# Patient Record
Sex: Female | Born: 1985 | Race: Black or African American | Hispanic: No | Marital: Single | Smoking: Current every day smoker
Health system: Southern US, Community
[De-identification: ages and names within clinical notes are randomized; demographics above are authoritative.]

## PROBLEM LIST (undated history)

## (undated) DIAGNOSIS — J45909 Unspecified asthma, uncomplicated: Secondary | ICD-10-CM

---

## 2013-03-19 ENCOUNTER — Emergency Department (HOSPITAL_COMMUNITY): Payer: Medicaid Other

## 2013-03-19 ENCOUNTER — Encounter (HOSPITAL_COMMUNITY): Payer: Self-pay | Admitting: Emergency Medicine

## 2013-03-19 ENCOUNTER — Emergency Department (HOSPITAL_COMMUNITY)
Admission: EM | Admit: 2013-03-19 | Discharge: 2013-03-20 | Disposition: A | Discharge status: Home / Self Care | Payer: Medicaid Other | Attending: Emergency Medicine | Admitting: Emergency Medicine

## 2013-03-19 DIAGNOSIS — R141 Gas pain: Secondary | ICD-10-CM | POA: Insufficient documentation

## 2013-03-19 DIAGNOSIS — R110 Nausea: Secondary | ICD-10-CM | POA: Insufficient documentation

## 2013-03-19 DIAGNOSIS — O99330 Smoking (tobacco) complicating pregnancy, unspecified trimester: Secondary | ICD-10-CM | POA: Insufficient documentation

## 2013-03-19 DIAGNOSIS — R142 Eructation: Secondary | ICD-10-CM | POA: Insufficient documentation

## 2013-03-19 DIAGNOSIS — O2390 Unspecified genitourinary tract infection in pregnancy, unspecified trimester: Secondary | ICD-10-CM | POA: Insufficient documentation

## 2013-03-19 DIAGNOSIS — Z3490 Encounter for supervision of normal pregnancy, unspecified, unspecified trimester: Secondary | ICD-10-CM

## 2013-03-19 DIAGNOSIS — N390 Urinary tract infection, site not specified: Secondary | ICD-10-CM | POA: Insufficient documentation

## 2013-03-19 LAB — COMPREHENSIVE METABOLIC PANEL
ALT: 10 U/L (ref 0–35)
AST: 22 U/L (ref 0–37)
Albumin: 2.9 g/dL — ABNORMAL LOW (ref 3.5–5.2)
Alkaline Phosphatase: 61 U/L (ref 39–117)
BUN: 7 mg/dL (ref 6–23)
CO2: 23 mEq/L (ref 19–32)
Calcium: 8.9 mg/dL (ref 8.4–10.5)
Chloride: 104 mEq/L (ref 96–112)
GFR calc non Af Amer: >90 mL/min (ref >90)
Potassium: 3.3 mEq/L — ABNORMAL LOW (ref 3.5–5.1)
Sodium: 137 mEq/L (ref 135–145)
Total Bilirubin: 0.4 mg/dL (ref 0.3–1.2)

## 2013-03-19 LAB — URINE MICROSCOPIC-ADD ON

## 2013-03-19 LAB — URINALYSIS, ROUTINE W REFLEX MICROSCOPIC
Bilirubin Urine: NEGATIVE
Hgb urine dipstick: NEGATIVE
Ketones, ur: 15 mg/dL — AB
Protein, ur: NEGATIVE mg/dL
Specific Gravity, Urine: 1.033 — ABNORMAL HIGH (ref 1.005–1.030)
Urobilinogen, UA: 1 mg/dL (ref 0.0–1.0)
pH: 6 (ref 5.0–8.0)

## 2013-03-19 LAB — POCT I-STAT, CHEM 8
BUN: 5 mg/dL — ABNORMAL LOW (ref 6–23)
Creatinine, Ser: 0.5 mg/dL (ref 0.50–1.10)
Glucose, Bld: 74 mg/dL (ref 70–99)
Hemoglobin: 11.2 g/dL — ABNORMAL LOW (ref 12.0–15.0)
Potassium: 3.4 mEq/L — ABNORMAL LOW (ref 3.5–5.1)
TCO2: 23 mmol/L (ref 0–100)

## 2013-03-19 LAB — LIPASE, BLOOD: Lipase: 35 U/L (ref 11–59)

## 2013-03-19 MED ORDER — ACETAMINOPHEN 500 MG PO TABS
1000.0000 mg | ORAL_TABLET | Freq: Once | ORAL | Status: AC
  Administered 2013-03-20: 1000 mg via ORAL
  Filled 2013-03-19: qty 2

## 2013-03-19 NOTE — ED Notes (Signed)
Pt c/o right sided lower quadrant abdominal pain. Pt is [redacted] weeks pregnant. This 7th pregnancy. She has 4 kids. 2 abortions, o miscarriages. Denies N/V.

## 2013-03-19 NOTE — ED Provider Notes (Signed)
CSN: [id number]     Arrival date & time 03/19/13  1904 History   First MD Initiated Contact with Patient 03/19/13 2154     Chief Complaint  Patient presents with  . Abdominal Pain   (Consider location/radiation/quality/duration/timing/severity/associated sxs/prior Treatment) HPI Comments: Patient is a 27 year old G50P4 female who presents today with right lower quadrant abdominal pain. She believes she is approximately [redacted] weeks pregnant, however has not taken a positive pregnancy test. She began receiving Depo shots in July. Initially they gave her a pregnancy test which was negative. Her last Depo shot was on September 23. She did not receive a pregnancy test prior to receiving a shot. Over the past 2 days she is having right lower quadrant abdominal pain. She reports that this pain feels like a bad menstrual cramps. It is intermittent and resolves spontaneously. She denies any abnormal vaginal discharge or vaginal bleeding. She denies any loss of appetite or anorexia. She had some nausea yesterday, but no vomiting. Her last normal bowel movement was yesterday. She has had no prenatal care and is not taking any prenatal vitamins. She just recently moved here from [place], [place] and is trying to get her Medicaid switched over. She gave birth to her last child on May 3. All of her children have been spontaneous vaginal deliveries. Her last child was 2 months premature. She had abortion surgeries in 2004 and 2012. She believes that she is never required RhoGAM. She does not remember her blood type.  The history is provided by the patient. No language interpreter was used.    History reviewed. No pertinent past medical history. History reviewed. No pertinent past surgical history. History reviewed. No pertinent family history. History  Substance Use Topics  . Smoking status: Current Every Day Smoker -- 0.50 packs/day    Types: Cigarettes  . Smokeless tobacco: Never Used  . Alcohol Use:  No   OB History   Grav Para Term Preterm Abortions TAB SAB Ect Mult Living                 Review of Systems  Constitutional: Negative for fever and chills.  Respiratory: Negative for shortness of breath.   Cardiovascular: Negative for chest pain.  Gastrointestinal: Positive for nausea, abdominal pain and abdominal distention. Negative for vomiting, diarrhea and constipation.  Genitourinary: Negative for dysuria, urgency, frequency, vaginal bleeding, vaginal discharge and vaginal pain.  All other systems reviewed and are negative.    Allergies  Sulfa antibiotics  Home Medications  No current outpatient prescriptions on file. BP 139/65  Pulse 83  Temp(Src) 98.8 F (37.1 C) (Oral)  Resp 16  Wt 142 lb 6.4 oz (64.592 kg)  SpO2 99%  LMP 09/03/2012 Physical Exam  Nursing note and vitals reviewed. Constitutional: She is oriented to person, place, and time. She appears well-developed and well-nourished. No distress.  HENT:  Head: Normocephalic and atraumatic.  Right Ear: External ear normal.  Left Ear: External ear normal.  Nose: Nose normal.  Mouth/Throat: Oropharynx is clear and moist.  Eyes: Conjunctivae are normal.  Neck: Normal range of motion.  Cardiovascular: Normal rate, regular rhythm and normal heart sounds.   Pulmonary/Chest: Effort normal and breath sounds normal. No stridor. No respiratory distress. She has no wheezes. She has no rales.  Abdominal: Soft. She exhibits distension. There is tenderness in the right lower quadrant. There is no rigidity, no rebound and no guarding.    Musculoskeletal: Normal range of motion.  Neurological: She is alert  and oriented to person, place, and time. She has normal strength.  Skin: Skin is warm and dry. She is not diaphoretic. No erythema.  Psychiatric: She has a normal mood and affect. Her behavior is normal.    ED Course  Procedures (including critical care time) Labs Review Labs Reviewed  COMPREHENSIVE METABOLIC  PANEL - Abnormal; Notable for the following:    Potassium 3.3 (*)    Glucose, Bld 69 (*)    Creatinine, Ser 0.44 (*)    Albumin 2.9 (*)    All other components within normal limits  URINALYSIS, ROUTINE W REFLEX MICROSCOPIC - Abnormal; Notable for the following:    Color, Urine AMBER (*)    Specific Gravity, Urine 1.033 (*)    Ketones, ur 15 (*)    Nitrite POSITIVE (*)    All other components within normal limits  CBC WITH DIFFERENTIAL - Abnormal; Notable for the following:    RBC 3.45 (*)    Hemoglobin 10.0 (*)    HCT 30.3 (*)    All other components within normal limits  URINE MICROSCOPIC-ADD ON - Abnormal; Notable for the following:    Squamous Epithelial / LPF FEW (*)    Bacteria, UA MANY (*)    All other components within normal limits  POCT I-STAT, CHEM 8 - Abnormal; Notable for the following:    Potassium 3.4 (*)    BUN 5 (*)    Calcium, Ion 1.25 (*)    Hemoglobin 11.2 (*)    HCT 33.0 (*)    All other components within normal limits  POCT PREGNANCY, URINE - Abnormal; Notable for the following:    Preg Test, Ur POSITIVE (*)    All other components within normal limits  LIPASE, BLOOD  ABO/RH   Imaging Review US Ob Limited  03/19/2013   CLINICAL DATA:  27 year old pregnant female with right pelvic pain. Assigned gestational age of [redacted] weeks 5 days by 1st ultrasound.  EXAM: LIMITED OBSTETRIC ULTRASOUND AND TRANSVAGINAL OBSTETRIC ULTRASOUND  FINDINGS: Number of Fetuses: 1  Heart Rate:  144 bpm  Movement: Yes  Presentation: Cephalic  Placental Location: Fundal posterior  Previa:  No  Amniotic Fluid (Subjective):  Within normal limits.  BPD:  71.5cm  28w  5d  MATERNAL FINDINGS:  Cervix:  Appears closed.  Length of 3.7 cm.  Uterus/Adnexae:  No abnormality visualized.  IMPRESSION: Single living intrauterine gestation with assigned gestational age of [redacted] weeks 5 days.  No significant abnormalities identified.  This exam is performed on an emergent basis and does not comprehensively  evaluate fetal size, dating, or anatomy; follow-up complete OB US should be considered if further fetal assessment is warranted.   Electronically Signed   By: Laveda Abbe M.D.   On: 03/19/2013 23:56   US Ob Transvaginal  03/19/2013   CLINICAL DATA:  27 year old pregnant female with right pelvic pain. Assigned gestational age of [redacted] weeks 5 days by 1st ultrasound.  EXAM: LIMITED OBSTETRIC ULTRASOUND AND TRANSVAGINAL OBSTETRIC ULTRASOUND  FINDINGS: Number of Fetuses: 1  Heart Rate:  144 bpm  Movement: Yes  Presentation: Cephalic  Placental Location: Fundal posterior  Previa:  No  Amniotic Fluid (Subjective):  Within normal limits.  BPD:  71.5cm  28w  5d  MATERNAL FINDINGS:  Cervix:  Appears closed.  Length of 3.7 cm.  Uterus/Adnexae:  No abnormality visualized.  IMPRESSION: Single living intrauterine gestation with assigned gestational age of [redacted] weeks 5 days.  No significant abnormalities identified.  This exam is performed  on an emergent basis and does not comprehensively evaluate fetal size, dating, or anatomy; follow-up complete OB US should be considered if further fetal assessment is warranted.   Electronically Signed   By: Laveda Abbe M.D.   On: 03/19/2013 23:56    EKG Interpretation   None       MDM   1. Pregnancy   2. UTI (lower urinary tract infection)    Patient presents with abdominal cramping. Diagnosed with pregnancy and third trimester. Fetus is approximately 28 weeks and 5 days. No significant abnormalities were noted on the ultrasound. Cramping is worse in the right lower quadrant. I have very little concern for appendicitis at this time. I did give the patient strict return instructions.  I have urged the patient to followup with an obstetrics physician. She was given a Facilities manager as well as information to [facility]. No abnormal vaginal bleeding or vaginal discharge. Urged patient to begin prenatal vitamins as soon as possible. Patient also diagnosed with urinary tract infection.  She was given Keflex. I discussed this case with Dr. Deretha Emory who agrees with plan. Vital signs stable for discharge. Patient / Family / Caregiver informed of clinical course, understand medical decision-making process, and agree with plan.    Mora Bellman, PA-C 03/20/13 0130

## 2013-03-20 LAB — CBC WITH DIFFERENTIAL/PLATELET
Basophils Absolute: 0 10*3/uL (ref 0.0–0.1)
Basophils Relative: 0 % (ref 0–1)
Eosinophils Absolute: 0.3 10*3/uL (ref 0.0–0.7)
Hemoglobin: 10 g/dL — ABNORMAL LOW (ref 12.0–15.0)
Lymphocytes Relative: 29 % (ref 12–46)
MCH: 29 pg (ref 26.0–34.0)
MCHC: 33 g/dL (ref 30.0–36.0)
Monocytes Absolute: 0.6 10*3/uL (ref 0.1–1.0)
Monocytes Relative: 9 % (ref 3–12)
Neutro Abs: 4 10*3/uL (ref 1.7–7.7)
Neutrophils Relative %: 57 % (ref 43–77)
RDW: 12.4 % (ref 11.5–15.5)
WBC: 7 10*3/uL (ref 4.0–10.5)

## 2013-03-20 LAB — ABO/RH: ABO/RH(D): A POS

## 2013-03-20 MED ORDER — CEPHALEXIN 500 MG PO CAPS: ORAL_CAPSULE | ORAL | Status: DC

## 2013-03-23 NOTE — ED Provider Notes (Signed)
Medical screening examination/treatment/procedure(s) were performed by non-physician practitioner and as supervising physician I was immediately available for consultation/collaboration.  EKG Interpretation   None         Yumna Ebers W. Zaiden Ludlum, MD 03/23/13 1527 

## 2013-03-31 NOTE — L&D Delivery Note (Signed)
Delivery Note NN team present prior to delivery. SROM> clear with pushing. At 1:18 PM a vigorous viable female was delivered via  (Presentation: OA>LOA;  ).  APGAR: , ; weight .   Placenta status: spontaneous, intact, 3VC. Cord:  with the following complications: none .  Cord pH: pending Anesthesia:  none Episiotomy: none Lacerations: none Suture Repair: n/a Est. Blood Loss (mL): 400 Placenta to pathology  Mom to postpartum.  Baby to NICU.  POE,DEIRDRE 01/27/2014, 1:34 PM

## 2013-03-31 NOTE — L&D Delivery Note (Signed)
Delivery Note At 8:32 AM a viable female was delivered via Vaginal, Spontaneous Delivery (Presentation: frank breech).  APGAR: 7,8 ; weight TBD.   Placenta status: Intact, Spontaneous.  Cord: 3 vessels with the following complications: .  Cord pH: 7.17  Anesthesia: None  Episiotomy: None Lacerations: n/a Suture Repair: na Est. Blood Loss (mL): 150cc  Mom to postpartum.  Baby to NICU.  Pt presented in MAU c/c/+2 and discovered to be frank breech presentation.  She pushed with good maternal effort and no epidural to deliver a liveborn preterm female from frank breech in the usual fashion.  Baby with spontaneous cry.  Cord cut and clamped and newborn transferred to the warmer under the care of the NICU team.   Placenta delivered spontaneously intact with 3 vessel cord.  Sent to pathology.  IM pitocin given. Mom doing well and baby to NICU due to prematurity.    [name] L 04/21/2013, 8:50 AM

## 2013-04-13 ENCOUNTER — Encounter: Payer: Medicaid Other | Admitting: Family

## 2013-04-13 ENCOUNTER — Encounter (HOSPITAL_COMMUNITY): Payer: Self-pay | Admitting: Emergency Medicine

## 2013-04-13 ENCOUNTER — Emergency Department (HOSPITAL_COMMUNITY)
Admission: EM | Admit: 2013-04-13 | Discharge: 2013-04-13 | Disposition: A | Discharge status: Home / Self Care | Payer: Medicaid Other | Attending: Emergency Medicine | Admitting: Emergency Medicine

## 2013-04-13 DIAGNOSIS — J45909 Unspecified asthma, uncomplicated: Secondary | ICD-10-CM | POA: Insufficient documentation

## 2013-04-13 DIAGNOSIS — O2390 Unspecified genitourinary tract infection in pregnancy, unspecified trimester: Secondary | ICD-10-CM | POA: Insufficient documentation

## 2013-04-13 DIAGNOSIS — N390 Urinary tract infection, site not specified: Secondary | ICD-10-CM | POA: Insufficient documentation

## 2013-04-13 DIAGNOSIS — O209 Hemorrhage in early pregnancy, unspecified: Secondary | ICD-10-CM | POA: Insufficient documentation

## 2013-04-13 DIAGNOSIS — O99330 Smoking (tobacco) complicating pregnancy, unspecified trimester: Secondary | ICD-10-CM | POA: Insufficient documentation

## 2013-04-13 DIAGNOSIS — Z79899 Other long term (current) drug therapy: Secondary | ICD-10-CM | POA: Insufficient documentation

## 2013-04-13 DIAGNOSIS — O2340 Unspecified infection of urinary tract in pregnancy, unspecified trimester: Secondary | ICD-10-CM

## 2013-04-13 HISTORY — DX: Unspecified asthma, uncomplicated: J45.909

## 2013-04-13 LAB — URINE MICROSCOPIC-ADD ON

## 2013-04-13 LAB — URINALYSIS, ROUTINE W REFLEX MICROSCOPIC
GLUCOSE, UA: NEGATIVE mg/dL
HGB URINE DIPSTICK: NEGATIVE
Ketones, ur: NEGATIVE mg/dL
Nitrite: POSITIVE — AB
PH: 6.5 (ref 5.0–8.0)
Protein, ur: NEGATIVE mg/dL
Specific Gravity, Urine: 1.029 (ref 1.005–1.030)
UROBILINOGEN UA: 1 mg/dL (ref 0.0–1.0)

## 2013-04-13 MED ORDER — NITROFURANTOIN MONOHYD MACRO 100 MG PO CAPS: 100.0000 mg | ORAL_CAPSULE | Freq: Two times a day (BID) | ORAL | Status: DC

## 2013-04-13 NOTE — Discharge Instructions (Signed)
1. Medications: Macrobid, usual home medications 2. Treatment: rest, drink plenty of fluids,  3. Follow Up: Please followup with the women's clinic, OB/GYN who has resected with your appointment for 04/28/2013 at 9:38 AM.  He may contact them at [phone]  Preterm Labor Information Preterm labor is when labor starts at less than 37 weeks of pregnancy. The normal length of a pregnancy is 39 to 41 weeks. CAUSES Often, there is no identifiable underlying cause as to why a woman goes into preterm labor. One of the most common known causes of preterm labor is infection. Infections of the uterus, cervix, vagina, amniotic sac, bladder, kidney, or even the lungs (pneumonia) can cause labor to start. Other suspected causes of preterm labor include:   Urogenital infections, such as yeast infections and bacterial vaginosis.   Uterine abnormalities (uterine shape, uterine septum, fibroids, or bleeding from the placenta).   A cervix that has been operated on (it may fail to stay closed).   Malformations in the fetus.   Multiple gestations (twins, triplets, and so on).   Breakage of the amniotic sac.  RISK FACTORS  Having a previous history of preterm labor.   Having premature rupture of membranes (PROM).   Having a placenta that covers the opening of the cervix (placenta previa).   Having a placenta that separates from the uterus (placental abruption).   Having a cervix that is too weak to hold the fetus in the uterus (incompetent cervix).   Having too much fluid in the amniotic sac (polyhydramnios).   Taking illegal drugs or smoking while pregnant.   Not gaining enough weight while pregnant.   Being younger than 79 and older than 29 years old.   Having a low socioeconomic status.   Being African American. SYMPTOMS Signs and symptoms of preterm labor include:   Menstrual-like cramps, abdominal pain, or back pain.  Uterine contractions that are regular, as  frequent as six in an hour, regardless of their intensity (may be mild or painful).  Contractions that start on the top of the uterus and spread down to the lower abdomen and back.   A sense of increased pelvic pressure.   A watery or bloody mucus discharge that comes from the vagina.  TREATMENT Depending on the length of the pregnancy and other circumstances, your health care provider may suggest bed rest. If necessary, there are medicines that can be given to stop contractions and to mature the fetal lungs. If labor happens before 34 weeks of pregnancy, a prolonged hospital stay may be recommended. Treatment depends on the condition of both you and the fetus.  WHAT SHOULD YOU DO IF YOU THINK YOU ARE IN PRETERM LABOR? Call your health care provider right away. You will need to go to the hospital to get checked immediately. HOW CAN YOU PREVENT PRETERM LABOR IN FUTURE PREGNANCIES? You should:   Stop smoking if you smoke.  Maintain healthy weight gain and avoid chemicals and drugs that are not necessary.  Be watchful for any type of infection.  Inform your health care provider if you have a known history of preterm labor. Document Released: 06/07/2003 Document Revised: 11/17/2012 Document Reviewed: 04/19/2012 [facility] Patient Information 2014 [place], [place].  Urinary Tract Infection Urinary tract infections (UTIs) can develop anywhere along your urinary tract. Your urinary tract is your body's drainage system for removing wastes and extra water. Your urinary tract includes two kidneys, two ureters, a bladder, and a urethra. Your kidneys are a pair of bean-shaped organs. Each  kidney is about the size of your fist. They are located below your ribs, one on each side of your spine. CAUSES Infections are caused by microbes, which are microscopic organisms, including fungi, viruses, and bacteria. These organisms are so small that they can only be seen through a microscope. Bacteria are the  microbes that most commonly cause UTIs. SYMPTOMS  Symptoms of UTIs may vary by age and gender of the patient and by the location of the infection. Symptoms in young women typically include a frequent and intense urge to urinate and a painful, burning feeling in the bladder or urethra during urination. Older women and men are more likely to be tired, shaky, and weak and have muscle aches and abdominal pain. A fever may mean the infection is in your kidneys. Other symptoms of a kidney infection include pain in your back or sides below the ribs, nausea, and vomiting. DIAGNOSIS To diagnose a UTI, your caregiver will ask you about your symptoms. Your caregiver also will ask to provide a urine sample. The urine sample will be tested for bacteria and white blood cells. White blood cells are made by your body to help fight infection. TREATMENT  Typically, UTIs can be treated with medication. Because most UTIs are caused by a bacterial infection, they usually can be treated with the use of antibiotics. The choice of antibiotic and length of treatment depend on your symptoms and the type of bacteria causing your infection. HOME CARE INSTRUCTIONS  If you were prescribed antibiotics, take them exactly as your caregiver instructs you. Finish the medication even if you feel better after you have only taken some of the medication.  Drink enough water and fluids to keep your urine clear or pale yellow.  Avoid caffeine, tea, and carbonated beverages. They tend to irritate your bladder.  Empty your bladder often. Avoid holding urine for long periods of time.  Empty your bladder before and after sexual intercourse.  After a bowel movement, women should cleanse from front to back. Use each tissue only once. SEEK MEDICAL CARE IF:   You have back pain.  You develop a fever.  Your symptoms do not begin to resolve within 3 days. SEEK IMMEDIATE MEDICAL CARE IF:   You have severe back pain or lower abdominal  pain.  You develop chills.  You have nausea or vomiting.  You have continued burning or discomfort with urination. MAKE SURE YOU:   Understand these instructions.  Will watch your condition.  Will get help right away if you are not doing well or get worse. Document Released: 12/25/2004 Document Revised: 09/16/2011 Document Reviewed: 04/25/2011 [facility] Patient Information 2014 [place], [place].

## 2013-04-13 NOTE — Progress Notes (Signed)
Appoint[facility] made for Pt January 29th, 2015 [facility] 9[facility]30am [facility]

## 2013-04-13 NOTE — Progress Notes (Addendum)
History reviewed with pt. Pt states she is a G7P4 with all previous vaginal deliveries with one at 32 weeks May 2014. Pt reports having pink tinged discharged noted on tissue after voiding a few times this a.m. [facility] but did not make that appointment and came to ED for evaluation of pink d/c. Pt was previously seen in ED 03/19/13 and found to be 28 weeks and 5 days pregnant. Pt denies any pain, contractions, leakage of fluid or any other discharge. Pt noticed urine was cloudy and yellow. Pt denies pain, pressure or burning with urinating. Pt reports positive fetal movement. U/S reviewed on 03/19/13 to confirm G.A

## 2013-04-13 NOTE — ED Notes (Signed)
Rapid response ob nurse [place]

## 2013-04-13 NOTE — ED Provider Notes (Signed)
CSN: [id number]     Arrival date & time 04/13/13  1357 History  This chart was scribed for non-physician practitioner Dierdre ForthHannah Aldridge Krzyzanowski, PA-C working with Flint MelterElliott L Wentz, MD by Danella Maiersaroline Early, ED Scribe. This patient was seen in room TR10C/TR10C and the patient's care was started at 3:14 PM.    Chief Complaint  Patient presents with  . Hematuria   The history is provided by the patient. No language interpreter was used.   HPI Comments: [name] is a 28 y.o. female who is [redacted] weeks pregnant who presents to the Emergency Department complaining of hematuria. Pt states she urinated and 7 am this morning and when she wiped there was pink on the toilet paper. She wiped again and there was more pink. She states the same thing happened after urinating at 10:30am but not since. She denies abdominal pain, nausea, vomiting, dysuria, any other symptoms. She states she plans [facility] an OB [facility] but does not have one yet because she just found out she was pregnant [redacted] weeks ago when she came here for abdominal pain. She has had 7 pregnancies, 4 living children (all were term except last child 32 weeks), and 2 elective abortions.    Past Medical History  Diagnosis Date  . Asthma    History reviewed. No pertinent past surgical history. No family history on file. History  Substance Use Topics  . Smoking status: Current Every Day Smoker -- 0.50 packs/day    Types: Cigarettes  . Smokeless tobacco: Never Used  . Alcohol Use: No   OB History   Grav Para Term Preterm Abortions TAB SAB Ect Mult Living   7 4 3 1 2 2  0 0 0 4     Review of Systems  Constitutional: Negative for fever, diaphoresis, appetite change, fatigue and unexpected weight change.  HENT: Negative for mouth sores.   Eyes: Negative for visual disturbance.  Respiratory: Negative for cough, chest tightness, shortness of breath and wheezing.   Cardiovascular: Negative for chest pain.  Gastrointestinal: Negative for  nausea, vomiting, abdominal pain, diarrhea and constipation.  Endocrine: Negative for polydipsia, polyphagia and polyuria.  Genitourinary: Positive for vaginal bleeding. Negative for dysuria, urgency, frequency and hematuria.  Musculoskeletal: Negative for back pain and neck stiffness.  Skin: Negative for rash.  Allergic/Immunologic: Negative for immunocompromised state.  Neurological: Negative for syncope, light-headedness and headaches.  Hematological: Does not bruise/bleed easily.  Psychiatric/Behavioral: Negative for sleep disturbance. The patient is not nervous/anxious.     Allergies  Sulfa antibiotics  Home Medications   Current Outpatient Rx  Name  Route  Sig  Dispense  Refill  . Prenatal Vit-Fe Sulfate-FA (PRENATAL VITAMIN PO)   Oral   Take 1 tablet by mouth daily.         . nitrofurantoin, macrocrystal-monohydrate, (MACROBID) 100 MG capsule   Oral   Take 1 capsule (100 mg total) by mouth 2 (two) times daily. X 7 days   14 capsule   0    BP 112/75  Pulse 93  Temp(Src) 98.7 F (37.1 C)  Resp 16  Ht 5\' 5"  (1.651 m)  Wt 148 lb (67.132 kg)  BMI 24.63 kg/m2  SpO2 98%  LMP 09/03/2012 Physical Exam  Nursing note and vitals reviewed. Constitutional: She is oriented to person, place, and time. She appears well-developed and well-nourished. No distress.  Awake, alert, nontoxic appearance  HENT:  Head: Normocephalic and atraumatic.  Mouth/Throat: Oropharynx is clear and moist. No oropharyngeal exudate.  Eyes:  Conjunctivae are normal. No scleral icterus.  Neck: Normal range of motion. Neck supple.  Cardiovascular: Normal rate, regular rhythm, normal heart sounds and intact distal pulses.   No murmur heard. Pulmonary/Chest: Effort normal and breath sounds normal. No respiratory distress. She has no wheezes.  Clear and equal breath sounds  Abdominal: Soft. Bowel sounds are normal. She exhibits no distension and no mass. There is no hepatosplenomegaly. There is no  tenderness. There is no rebound, no guarding and no CVA tenderness. Hernia confirmed negative in the right inguinal area and confirmed negative in the left inguinal area.  Gravid uterus Palpable fetal movement No palpable uterine contractions  Genitourinary: Uterus normal. Pelvic exam was performed with patient supine. There is no rash, tenderness or lesion on the right labia. There is no rash, tenderness or lesion on the left labia. No erythema, tenderness or bleeding around the vagina. No foreign body around the vagina. No signs of injury around the vagina. Vaginal discharge (scant, pink) found.  Sterile speculum exam without gross blood, scant pink discharge, cervix appears closed.  Musculoskeletal: Normal range of motion. She exhibits no edema.  Lymphadenopathy:    She has no cervical adenopathy.       Right: No inguinal adenopathy present.       Left: No inguinal adenopathy present.  Neurological: She is alert and oriented to person, place, and time. She exhibits normal muscle tone. Coordination normal.  Speech is clear and goal oriented Moves extremities without ataxia  Skin: Skin is warm and dry. No rash noted. She is not diaphoretic. No erythema.  Psychiatric: She has a normal mood and affect. Her behavior is normal.    ED Course  Procedures (including critical care time) Medications - No data to display  DIAGNOSTIC STUDIES: Oxygen Saturation is 98% on RA, normal by my interpretation.    COORDINATION OF CARE: 3:29 PM- Discussed treatment plan with pt. Pt agrees to plan.    Labs Review Labs Reviewed  URINALYSIS, ROUTINE W REFLEX MICROSCOPIC - Abnormal; Notable for the following:    APPearance CLOUDY (*)    Bilirubin Urine SMALL (*)    Nitrite POSITIVE (*)    Leukocytes, UA MODERATE (*)    All other components within normal limits  URINE MICROSCOPIC-ADD ON - Abnormal; Notable for the following:    Squamous Epithelial / LPF FEW (*)    Bacteria, UA MANY (*)    Casts  HYALINE CASTS (*)    All other components within normal limits  URINE CULTURE   Imaging Review No results found.  EKG Interpretation   None       MDM   1. Urinary tract infection   2. Urinary tract infection during pregnancy      Ronnald Ramp presents with "vaginal bleeding" and no prenatal care.  No persistent vaginal bleeding. Patient reports last sexual intercourse was last night. G 7 [id number].  No blood in the vaginal vault on sterile speculum exam. UA with evidence of urinary tract infection. Patient evaluated by Joni Reining, rapid OB.  Baby appears healthy and she's not having contractions.   Pt has been diagnosed with a UTI. Pt is afebrile, no CVA tenderness, normotensive, and denies N/V. Pt to be dc home with antibiotics and instructions to follow up with OB/GYN.  Patient has followup appointment at [facility] clinic on 04/29/2011 at 9:30 AM.  It has been determined that no acute conditions requiring further emergency intervention are present at this time. The patient/guardian have been advised of the  diagnosis and plan. We have discussed signs and symptoms that warrant return to the ED, such as changes or worsening in symptoms.   Vital signs are stable at discharge.   BP 112/75  Pulse 93  Temp(Src) 98.7 F (37.1 C)  Resp 16  Ht 5\' 5"  (1.651 m)  Wt 148 lb (67.132 kg)  BMI 24.63 kg/m2  SpO2 98%  LMP 09/03/2012  Patient/guardian has voiced understanding and agreed to follow-up with the PCP or specialist.        Dierdre Forth, PA-C 04/13/13 1623

## 2013-04-13 NOTE — ED Notes (Signed)
States she went to use br wiped and there was blood unsure if vag or in urine pt is 32 week no prenatal care G 7 P4  A 2  L 4

## 2013-04-13 NOTE — Progress Notes (Signed)
Dr. Jolayne Pantheronstant called regarding pt visit to ED with c/o of pink discharge after wiping. Notified of category I FHR with no contraction. Notified of pink discharge that occurred this morning with no pain. MD stated do perform U/A and speculum to check for bleeding in the vaginal vault.

## 2013-04-13 NOTE — Progress Notes (Signed)
OB at ED. Pt waiting in triage, waiting on available room.

## 2013-04-13 NOTE — Progress Notes (Signed)
[facility] called regarding pt at 32 weeks with c/o vaginal bleeding. OB RR RN

## 2013-04-13 NOTE — Progress Notes (Signed)
SSE done small amount of pink mucus noted on end of speculum. No active bleeding noted and PA stated that the cervix was visually closed.

## 2013-04-14 NOTE — ED Provider Notes (Signed)
Medical screening examination/treatment/procedure(s) were performed by non-physician practitioner and as supervising physician I was immediately available for consultation/collaboration.  EKG Interpretation   None        Jenese Mischke L Sharley Keeler, MD 04/14/13 0012 

## 2013-04-15 LAB — URINE CULTURE: Colony Count: >=100000

## 2013-04-16 ENCOUNTER — Telehealth (HOSPITAL_COMMUNITY): Payer: Self-pay | Admitting: Emergency Medicine

## 2013-04-16 NOTE — ED Notes (Signed)
Post ED Visit - Positive Culture Follow-up  Culture report reviewed by antimicrobial stewardship pharmacist: []  Wes Dulaney, Pharm.D., BCPS []  Celedonio MiyamotoJeremy Frens, [address]Pharm[address]D., BCPS []  Georgina PillionElizabeth Martin, Pharm.D., BCPS [x]  DearingMinh Pham, [place]arm[place]D., BCPS, AAHIVP []  Estella HuskMichelle Turner, Pharm.D., BCPS, AAHIVP  Positive urine culture Treated with Macrobid, organism sensitive to the same and no further patient follow-up is required at this time.  Zeb ComfortHolland, Akaya Proffit 04/16/2013, 2:31 PM

## 2013-04-21 ENCOUNTER — Encounter (HOSPITAL_COMMUNITY): Payer: Self-pay | Admitting: *Deleted

## 2013-04-21 ENCOUNTER — Inpatient Hospital Stay (HOSPITAL_COMMUNITY)
Admission: AD | Admit: 2013-04-21 | Discharge: 2013-04-23 | DRG: 775 | Disposition: A | Discharge status: Home / Self Care | Payer: Medicaid Other | Source: Ambulatory Visit | Attending: Obstetrics & Gynecology | Admitting: Obstetrics & Gynecology

## 2013-04-21 DIAGNOSIS — O321XX0 Maternal care for breech presentation, not applicable or unspecified: Secondary | ICD-10-CM

## 2013-04-21 DIAGNOSIS — Z3490 Encounter for supervision of normal pregnancy, unspecified, unspecified trimester: Secondary | ICD-10-CM

## 2013-04-21 DIAGNOSIS — O99334 Smoking (tobacco) complicating childbirth: Secondary | ICD-10-CM | POA: Diagnosis present

## 2013-04-21 DIAGNOSIS — O4700 False labor before 37 completed weeks of gestation, unspecified trimester: Secondary | ICD-10-CM

## 2013-04-21 LAB — RPR: RPR: NONREACTIVE

## 2013-04-21 LAB — RAPID URINE DRUG SCREEN, HOSP PERFORMED
Amphetamines: NOT DETECTED
BENZODIAZEPINES: NOT DETECTED
Barbiturates: NOT DETECTED
COCAINE: NOT DETECTED
OPIATES: NOT DETECTED
Tetrahydrocannabinol: NOT DETECTED

## 2013-04-21 LAB — CBC
HCT: 31.1 % — ABNORMAL LOW (ref 36.0–46.0)
HEMOGLOBIN: 10.3 g/dL — AB (ref 12.0–15.0)
MCH: 28.8 pg (ref 26.0–34.0)
MCHC: 33.1 g/dL (ref 30.0–36.0)
MCV: 86.9 fL (ref 78.0–100.0)
Platelets: 214 10*3/uL (ref 150–400)
RBC: 3.58 MIL/uL — ABNORMAL LOW (ref 3.87–5.11)
RDW: 12.6 % (ref 11.5–15.5)
WBC: 10.9 10*3/uL — ABNORMAL HIGH (ref 4.0–10.5)

## 2013-04-21 LAB — DIFFERENTIAL
Basophils Absolute: 0 10*3/uL (ref 0.0–0.1)
Basophils Relative: 0 % (ref 0–1)
Eosinophils Absolute: 0.2 10*3/uL (ref 0.0–0.7)
Eosinophils Relative: 2 % (ref 0–5)
Lymphocytes Relative: 13 % (ref 12–46)
Lymphs Abs: 1.4 10*3/uL (ref 0.7–4.0)
Monocytes Absolute: 0.8 10*3/uL (ref 0.1–1.0)
Monocytes Relative: 7 % (ref 3–12)
Neutro Abs: 8.6 10*3/uL — ABNORMAL HIGH (ref 1.7–7.7)
Neutrophils Relative %: 79 % — ABNORMAL HIGH (ref 43–77)

## 2013-04-21 LAB — HEPATITIS B SURFACE ANTIGEN: Hepatitis B Surface Ag: NEGATIVE

## 2013-04-21 LAB — HIV ANTIBODY (ROUTINE TESTING W REFLEX): HIV: NONREACTIVE

## 2013-04-21 LAB — OB RESULTS CONSOLE GBS: GBS: NEGATIVE

## 2013-04-21 LAB — GROUP B STREP BY PCR: GROUP B STREP BY PCR: NEGATIVE

## 2013-04-21 MED ORDER — DIBUCAINE 1 % RE OINT: 1.0000 "application " | TOPICAL_OINTMENT | RECTAL | Status: DC | PRN

## 2013-04-21 MED ORDER — IBUPROFEN 600 MG PO TABS
600.0000 mg | ORAL_TABLET | Freq: Four times a day (QID) | ORAL | Status: DC
  Administered 2013-04-21 – 2013-04-23 (×8): 600 mg via ORAL
  Filled 2013-04-21 (×8): qty 1

## 2013-04-21 MED ORDER — ONDANSETRON HCL 4 MG PO TABS: 4.0000 mg | ORAL_TABLET | ORAL | Status: DC | PRN

## 2013-04-21 MED ORDER — PRENATAL MULTIVITAMIN CH
1.0000 | ORAL_TABLET | Freq: Every day | ORAL | Status: DC
  Administered 2013-04-22: 1 via ORAL
  Filled 2013-04-21: qty 1

## 2013-04-21 MED ORDER — ZOLPIDEM TARTRATE 5 MG PO TABS: 5.0000 mg | ORAL_TABLET | Freq: Every evening | ORAL | Status: DC | PRN

## 2013-04-21 MED ORDER — WITCH HAZEL-GLYCERIN EX PADS: 1.0000 "application " | MEDICATED_PAD | CUTANEOUS | Status: DC | PRN

## 2013-04-21 MED ORDER — OXYTOCIN 10 UNIT/ML IJ SOLN
10.0000 [IU] | Freq: Once | INTRAMUSCULAR | Status: AC
  Administered 2013-04-21: 10 [IU] via INTRAMUSCULAR

## 2013-04-21 MED ORDER — OXYTOCIN 10 UNIT/ML IJ SOLN
INTRAMUSCULAR | Status: AC
  Filled 2013-04-21: qty 1

## 2013-04-21 MED ORDER — ONDANSETRON HCL 4 MG/2ML IJ SOLN: 4.0000 mg | INTRAMUSCULAR | Status: DC | PRN

## 2013-04-21 MED ORDER — TETANUS-DIPHTH-ACELL PERTUSSIS 5-2.5-18.5 LF-MCG/0.5 IM SUSP
0.5000 mL | Freq: Once | INTRAMUSCULAR | Status: AC
  Administered 2013-04-22: 0.5 mL via INTRAMUSCULAR

## 2013-04-21 MED ORDER — MEASLES, MUMPS & RUBELLA VAC ~~LOC~~ INJ
0.5000 mL | INJECTION | Freq: Once | SUBCUTANEOUS | Status: AC
  Administered 2013-04-22: 0.5 mL via SUBCUTANEOUS
  Filled 2013-04-21 (×2): qty 0.5

## 2013-04-21 MED ORDER — SIMETHICONE 80 MG PO CHEW: 80.0000 mg | CHEWABLE_TABLET | ORAL | Status: DC | PRN

## 2013-04-21 MED ORDER — BENZOCAINE-MENTHOL 20-0.5 % EX AERO: 1.0000 "application " | INHALATION_SPRAY | CUTANEOUS | Status: DC | PRN

## 2013-04-21 MED ORDER — OXYCODONE-ACETAMINOPHEN 5-325 MG PO TABS: 1.0000 | ORAL_TABLET | ORAL | Status: DC | PRN

## 2013-04-21 MED ORDER — SENNOSIDES-DOCUSATE SODIUM 8.6-50 MG PO TABS
2.0000 | ORAL_TABLET | ORAL | Status: DC
  Administered 2013-04-21 – 2013-04-22 (×2): 2 via ORAL
  Filled 2013-04-21 (×2): qty 2

## 2013-04-21 MED ORDER — LANOLIN HYDROUS EX OINT: TOPICAL_OINTMENT | CUTANEOUS | Status: DC | PRN

## 2013-04-21 MED ORDER — DIPHENHYDRAMINE HCL 25 MG PO CAPS: 25.0000 mg | ORAL_CAPSULE | Freq: Four times a day (QID) | ORAL | Status: DC | PRN

## 2013-04-21 NOTE — MAU Provider Note (Signed)
Attestation of Attending Supervision of Advanced Practitioner (CNM/NP): Evaluation and management procedures were performed by the Advanced Practitioner under my supervision and collaboration. I have reviewed the Advanced Practitioner's note and chart, and I agree with the management and plan.  Sarie Stall H. 4:35 PM

## 2013-04-21 NOTE — Lactation Note (Signed)
This note was copied from the chart of Regina TransMontaigneShalonda Pipkins. Lactation Consultation Note    Initial consult with this mom of a NICU baby, now 5 hours post partum, and 33 3/[redacted] weeks gestation. I asked mom if she wanted to provide breast milk for her baby, and her answer was "I am not sure - I will not be able to visit daily..." I told her perhaps someone else could deliver the milk if she wanted to pump. I also told her to take time and think about it, and then let her nurse know if she wants to begin pumping. Mom reports she is active with  WIC.  Patient Name: Regina Bennett ZOXWR'UToday's Date: 04/21/2013 Reason for consult: Initial assessment;NICU baby   Maternal Data Formula Feeding for Exclusion: Yes (baby in the NICU) Reason for exclusion: Mother's choice to formula feed on admision  Feeding    LATCH Score/Interventions                      Lactation Tools Discussed/Used     Consult Status Consult Status: Follow-up Follow-up type: Call as needed    Alfred LevinsLee, Debie Ashline Anne 04/21/2013, 2:28 PM

## 2013-04-21 NOTE — Progress Notes (Signed)
Went in pt room found her in the shower without assist. Explained to pt did not need to be up. Had FOB come set in bathroom to monitor pt. Will call if feels faint. Encouraged pt to make it short. Pt agreeeable.

## 2013-04-21 NOTE — Progress Notes (Signed)
UR completed 

## 2013-04-21 NOTE — H&P (Signed)
  LABOR ADMISSION HISTORY AND PHYSICAL   Regina Bennett is a 28 y.o. female [id number] with IUP at 6269w3d by 28week US presenting for preterm labor. Pt states she woke up around 0630 with a large gush of fluid and regular contractions. These have intensified.  No vb. +FM  No PNC. Has not had a formal US. Dated by 2321w5d limited US.    Prenatal History/Complications:  Past Medical History: Past Medical History  Diagnosis Date  . Asthma     Past Surgical History: No past surgical history on file.  Obstetrical History: OB History   Grav Para Term Preterm Abortions TAB SAB Ect Mult Living   7 4 3 1 2 2  0 0 0 4        Social History: History   Social History  . Marital Status: Single    Spouse Name: N/A    Number of Children: N/A  . Years of Education: N/A   Social History Main Topics  . Smoking status: Current Every Day Smoker -- 0.50 packs/day    Types: Cigarettes  . Smokeless tobacco: Never Used  . Alcohol Use: No  . Drug Use: No  . Sexual Activity: Yes     Comment: last intercourse 04/11/13   Other Topics Concern  . Not on file   Social History Narrative  . No narrative on file    Family History: No family history on file.  Allergies: Allergies  Allergen Reactions  . Sulfa Antibiotics Other (See Comments)    Bad pains to back of neck    Prescriptions prior to admission  Medication Sig Dispense Refill  . nitrofurantoin, macrocrystal-monohydrate, (MACROBID) 100 MG capsule Take 1 capsule (100 mg total) by mouth 2 (two) times daily. X 7 days  14 capsule  0  . Prenatal Vit-Fe Sulfate-FA (PRENATAL VITAMIN PO) Take 1 tablet by mouth daily.         Review of Systems  All systems reviewed and negative except as stated in HPI    Last menstrual period 09/03/2012. General appearance: alert, cooperative and moderate distress with contractions.  Lungs: normal resp effort Heart: regular rate and rhythm Abdomen: soft, non-tender; bowel sounds  normal  Extremities: Homans sign is negative, no sign of DVT  Presentation: breech SVE: 10/100/+2      Prenatal labs: ABO, Rh: --/--/A POS (12/20 2215) Antibody:   Rubella:   RPR:    HBsAg:    HIV:    GBS:        Assessment: Regina RampShalonda Bennett is a 28 y.o. [id number] with an IUP at 4969w3d presenting for labor and now c/c/+2 and pushing with a frank breech preterm baby.   Plan: 1) admit - routine orders  2) FWB - GBS unknown - preterm - NICU present for delivery   3) Ancticipate SVD  Samanthan Dugo L, MD 04/21/2013, 8:52 AM

## 2013-04-21 NOTE — MAU Note (Addendum)
Pt arrived EMS with C/O SROM & uc's.  Thick meconium noted, M. Williams CNM @ bedside.  SVE complete, 0 station per M. Williams CNM.  Pt to room 168 by stretcher with CNM in attendance.

## 2013-04-21 NOTE — MAU Provider Note (Signed)
Late entry for events which occurred upon admission.  Called to see patient who just arrived via EMS with report of leaking green fluid and contractions at 0630.  This is a 28 y.o. female at 8848w3d who presented to ED inDecember for bleeding and newly diagnosed pregnancy Had just delivered in May 2014 at 32 weeks and had been on DepoProvera, so did not know she was pregnant.  An ultrasound was done then which showed EDC of 06/06/13. Baby was vertex then.   Has an appt 1/29 for new ob.  Upon arrival, pt was feeling pressure, leaking thick meconium.  Exam showed complete dilation with presenting part at -2 station (felt to be vertex at the time) Fetal heart rated noted to be 85-90. [facility] notified them of need for imminent delivery and to have NICU there.  When we arrived, pt was prepared for delivery and NICU arrived Patient pushed and as fetus descended, it was noted to be in complete breech position  Dr Penne LashLeggett notified immediately and arrived as breech was delivering with attendance by Dr Stephan MinisterBeck  Baby delivered and handed to NICU team.  See Delivery note

## 2013-04-21 NOTE — H&P (Signed)
Breech realized at +3 station and patient involuntarily pushing.  Breech delivery without complication.  Present for delivery.  Attestation of Attending Supervision of Fellow: Evaluation and management procedures were performed by the Fellow under my supervision and collaboration. I have reviewed the Fellow's note and chart, and I agree with the management and plan.

## 2013-04-22 LAB — CBC
HCT: 28.9 % — ABNORMAL LOW (ref 36.0–46.0)
Hemoglobin: 9.6 g/dL — ABNORMAL LOW (ref 12.0–15.0)
MCH: 28.7 pg (ref 26.0–34.0)
MCHC: 33.2 g/dL (ref 30.0–36.0)
MCV: 86.3 fL (ref 78.0–100.0)
Platelets: 240 10*3/uL (ref 150–400)
RBC: 3.35 MIL/uL — AB (ref 3.87–5.11)
RDW: 12.5 % (ref 11.5–15.5)
WBC: 8.8 10*3/uL (ref 4.0–10.5)

## 2013-04-22 LAB — RUBELLA SCREEN: Rubella: 0.82 Index (ref <0.90)

## 2013-04-22 NOTE — Progress Notes (Signed)
Post Partum Day 1 Subjective: no complaints, up ad lib, voiding, tolerating PO and + flatus  Objective: Blood pressure 114/77, pulse 74, temperature 97.7 F (36.5 C), temperature source Oral, resp. rate 18, height 5\' 5"  (1.651 m), weight 68.04 kg (150 lb), last menstrual period 09/03/2012, SpO2 99.00%, unknown if currently breastfeeding.  Physical Exam:  General: alert, cooperative, appears stated age and no distress Lochia: appropriate Uterine Fundus: firm DVT Evaluation: No evidence of DVT seen on physical exam. Negative Homan's sign. No cords or calf tenderness. No significant calf/ankle edema.   Recent Labs  04/21/13 0925 04/22/13 0520  HGB 10.3* 9.6*  HCT 31.1* 28.9*    Assessment/Plan: Plan for discharge tomorrow, Breastfeeding, Social Work consult and Contraception depo   LOS: 1 day   Danyel Griess RYAN 04/22/2013, 9:24 AM

## 2013-04-22 NOTE — Progress Notes (Signed)
Upon admission patient stated that she planned to bottle fed. Upon assessing patient tonight she asked if she could pump and breast feed baby. Explained to patient that she could and asked what changed her mind. Patient stated she has an 418 month old at home who was also a preemie and she breast feed for 2 months. She stated she thought it would beneficial for this baby as well. Patient set up with breast pump and consult put in for lactation to see patient prior to discharge tomorrow.
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
## 2013-04-23 ENCOUNTER — Ambulatory Visit: Payer: Self-pay

## 2013-04-23 LAB — RUBELLA SCREEN: RUBELLA: 0.8 {index} (ref <0.90)

## 2013-04-23 MED ORDER — IBUPROFEN 600 MG PO TABS: 600.0000 mg | ORAL_TABLET | Freq: Four times a day (QID) | ORAL | Status: DC

## 2013-04-23 NOTE — Lactation Note (Addendum)
This note was copied from the chart of Regina TransMontaigneShalonda Helming. Lactation Consultation Note    Follow up consult with this mom of a NICU baby, now 50 hours post partum, and 33 5/7 weeks corrected gestation. Mom decided yesterday, when she felt her milk transitioning in, to begin pumping and provide EBM for her baby. Mom is engorged, and not expressing any milk with pumping. Hand expression very painful. I instructed mom in engrogement care, giving her ice, and telling her to pump every 2-3 hours for 15 minutes, ans once her milk flows, to pump up to 30 minutes, until she stop dripping. Mom does have a DEP at home. I also gave mom a munual medela hand pump, and instructed her in it's use.  Mom knows to call for lactation help as needed. i will follow this family in the NICU  Patient Name: Regina Bennett WUJWJ'XToday's Date: 04/23/2013     Maternal Data    Feeding Feeding Type: Formula Nipple Type: Slow - flow Length of feed: 5 min  LATCH Score/Interventions                      Lactation Tools Discussed/Used     Consult Status      Alfred LevinsLee, Tyeshia Cornforth Anne 04/23/2013, 2:33 PM

## 2013-04-23 NOTE — Discharge Summary (Signed)
  Obstetric Discharge Summary Reason for Admission: onset of labor and rupture of membranes Prenatal Procedures: none Intrapartum Procedures: Spontaneous Vaginal Delivery, frank breech Postpartum Procedures: none Complications-Operative and Postpartum: none Hemoglobin  Date Value Range Status  04/22/2013 9.6* 12.0 - 15.0 g/dL Final     HCT  Date Value Range Status  04/22/2013 28.9* 36.0 - 46.0 % Final   [name] is a 27yo [id number] @ 33.3wks who presented via EMS to MAU completely dilated and pushing. She was found to be delivering in the frank breech presentation and this occurred without complication. On PPD #2 she is deemed to have received the full benefit of her hospital stay and will be discharged home. She is pumping milk for her infant.  [facility] 2 weeks in order to schedule an interval BTL.  Physical Exam:  General: alert, cooperative and no distress Lochia: appropriate Uterine Fundus: firm DVT Evaluation: No evidence of DVT seen on physical exam.  Discharge Diagnoses: Preterm Pregnancy-delivered and Premature labor  Discharge Information: Date: 04/23/2013 Activity: pelvic rest Diet: routine Medications: PNV and Ibuprofen Condition: stable Instructions: refer to practice specific booklet Discharge to: home Follow-up Information   [facility]. Call in 2 weeks.   Contact information:   [address] [place] [id number] [phone]      Newborn Data: Live born female  Birth Weight: 3 lb 8.4 oz (1600 g) APGAR: 7, 8   Baby in the NICU for prematurity. Reported by mother to be doing well, feeding and breathing on her own.   Regina Bennett, Regina 04/23/2013, 7:54 AM  I have seen and examined this patient and I agree with the above. Cam HaiSHAW, Regina CNM 8:42 AM 04/23/2013

## 2013-04-23 NOTE — Discharge Instructions (Signed)

## 2013-04-27 ENCOUNTER — Encounter: Payer: Self-pay | Admitting: *Deleted

## 2013-04-27 NOTE — Discharge Summary (Signed)
`````  Attestation of Attending Supervision of Advanced Practitioner: Evaluation and management procedures were performed by the PA/NP/CNM/OB Fellow under my supervision/collaboration. Chart reviewed and agree with management and plan.  Nakenya Theall V 04/27/2013 4:57 PM

## 2013-04-28 ENCOUNTER — Encounter: Payer: Medicaid Other | Admitting: Family

## 2013-05-11 ENCOUNTER — Ambulatory Visit: Payer: Medicaid Other | Admitting: Obstetrics & Gynecology

## 2014-01-27 ENCOUNTER — Encounter (HOSPITAL_COMMUNITY): Payer: Self-pay

## 2014-01-27 ENCOUNTER — Inpatient Hospital Stay (HOSPITAL_COMMUNITY): Payer: Medicaid Other

## 2014-01-27 ENCOUNTER — Inpatient Hospital Stay (HOSPITAL_COMMUNITY)
Admission: AD | Admit: 2014-01-27 | Discharge: 2014-01-29 | DRG: 774 | Disposition: A | Discharge status: Home / Self Care | Payer: Medicaid Other | Source: Ambulatory Visit | Attending: Family Medicine | Admitting: Family Medicine

## 2014-01-27 DIAGNOSIS — Z3A29 29 weeks gestation of pregnancy: Secondary | ICD-10-CM

## 2014-01-27 DIAGNOSIS — Z3A28 28 weeks gestation of pregnancy: Secondary | ICD-10-CM | POA: Diagnosis present

## 2014-01-27 DIAGNOSIS — O99334 Smoking (tobacco) complicating childbirth: Secondary | ICD-10-CM | POA: Diagnosis present

## 2014-01-27 DIAGNOSIS — O09219 Supervision of pregnancy with history of pre-term labor, unspecified trimester: Secondary | ICD-10-CM

## 2014-01-27 DIAGNOSIS — O4413 Placenta previa with hemorrhage, third trimester: Secondary | ICD-10-CM | POA: Diagnosis present

## 2014-01-27 DIAGNOSIS — N938 Other specified abnormal uterine and vaginal bleeding: Secondary | ICD-10-CM

## 2014-01-27 DIAGNOSIS — O0943 Supervision of pregnancy with grand multiparity, third trimester: Secondary | ICD-10-CM | POA: Diagnosis not present

## 2014-01-27 DIAGNOSIS — N939 Abnormal uterine and vaginal bleeding, unspecified: Secondary | ICD-10-CM

## 2014-01-27 DIAGNOSIS — O4403 Placenta previa specified as without hemorrhage, third trimester: Secondary | ICD-10-CM

## 2014-01-27 DIAGNOSIS — IMO0001 Reserved for inherently not codable concepts without codable children: Secondary | ICD-10-CM

## 2014-01-27 LAB — ABO/RH: ABO/RH(D): A POS

## 2014-01-27 LAB — COMPREHENSIVE METABOLIC PANEL
ALBUMIN: 2.6 g/dL — AB (ref 3.5–5.2)
ALT: 23 U/L (ref 0–35)
ANION GAP: 11 (ref 5–15)
AST: 41 U/L — AB (ref 0–37)
Alkaline Phosphatase: 75 U/L (ref 39–117)
BUN: 12 mg/dL (ref 6–23)
CO2: 21 mEq/L (ref 19–32)
Calcium: 8.7 mg/dL (ref 8.4–10.5)
Chloride: 104 mEq/L (ref 96–112)
Creatinine, Ser: 0.51 mg/dL (ref 0.50–1.10)
GFR calc Af Amer: >90 mL/min (ref >90)
GFR calc non Af Amer: >90 mL/min (ref >90)
Glucose, Bld: 90 mg/dL (ref 70–99)
Potassium: 3.8 mEq/L (ref 3.7–5.3)
SODIUM: 136 meq/L — AB (ref 137–147)
TOTAL PROTEIN: 6.7 g/dL (ref 6.0–8.3)
Total Bilirubin: 0.3 mg/dL (ref 0.3–1.2)

## 2014-01-27 LAB — CBC
HCT: 30.3 % — ABNORMAL LOW (ref 36.0–46.0)
HEMOGLOBIN: 10 g/dL — AB (ref 12.0–15.0)
MCH: 29 pg (ref 26.0–34.0)
MCHC: 33 g/dL (ref 30.0–36.0)
MCV: 87.8 fL (ref 78.0–100.0)
Platelets: 261 10*3/uL (ref 150–400)
RBC: 3.45 MIL/uL — ABNORMAL LOW (ref 3.87–5.11)
RDW: 12.6 % (ref 11.5–15.5)
WBC: 8.1 10*3/uL (ref 4.0–10.5)

## 2014-01-27 LAB — HEPATITIS B SURFACE ANTIGEN: Hepatitis B Surface Ag: NEGATIVE

## 2014-01-27 LAB — DIFFERENTIAL
Basophils Absolute: 0 10*3/uL (ref 0.0–0.1)
Basophils Relative: 0 % (ref 0–1)
Eosinophils Absolute: 0.2 10*3/uL (ref 0.0–0.7)
Eosinophils Relative: 2 % (ref 0–5)
Lymphocytes Relative: 19 % (ref 12–46)
Lymphs Abs: 1.6 10*3/uL (ref 0.7–4.0)
MONOS PCT: 6 % (ref 3–12)
Monocytes Absolute: 0.5 10*3/uL (ref 0.1–1.0)
NEUTROS ABS: 5.9 10*3/uL (ref 1.7–7.7)
NEUTROS PCT: 73 % (ref 43–77)

## 2014-01-27 LAB — RPR

## 2014-01-27 LAB — RUBELLA SCREEN: RUBELLA: 2.56 {index} — AB (ref <0.90)

## 2014-01-27 LAB — HIV ANTIBODY (ROUTINE TESTING W REFLEX): HIV 1&2 Ab, 4th Generation: NONREACTIVE

## 2014-01-27 MED ORDER — OXYCODONE-ACETAMINOPHEN 5-325 MG PO TABS: 2.0000 | ORAL_TABLET | ORAL | Status: DC | PRN

## 2014-01-27 MED ORDER — SODIUM CHLORIDE 0.9 % IV SOLN
2.0000 g | Freq: Once | INTRAVENOUS | Status: DC
  Filled 2014-01-27: qty 2000

## 2014-01-27 MED ORDER — ONDANSETRON HCL 4 MG PO TABS: 4.0000 mg | ORAL_TABLET | ORAL | Status: DC | PRN

## 2014-01-27 MED ORDER — MAGNESIUM SULFATE BOLUS VIA INFUSION
4.0000 g | Freq: Once | INTRAVENOUS | Status: AC
  Administered 2014-01-27: 4 g via INTRAVENOUS
  Filled 2014-01-27: qty 500

## 2014-01-27 MED ORDER — SIMETHICONE 80 MG PO CHEW: 80.0000 mg | CHEWABLE_TABLET | ORAL | Status: DC | PRN

## 2014-01-27 MED ORDER — LIDOCAINE HCL (PF) 1 % IJ SOLN: 30.0000 mL | INTRAMUSCULAR | Status: DC | PRN

## 2014-01-27 MED ORDER — ZOLPIDEM TARTRATE 5 MG PO TABS: 5.0000 mg | ORAL_TABLET | Freq: Every evening | ORAL | Status: DC | PRN

## 2014-01-27 MED ORDER — BETAMETHASONE SOD PHOS & ACET 6 (3-3) MG/ML IJ SUSP
12.0000 mg | Freq: Once | INTRAMUSCULAR | Status: AC
  Administered 2014-01-27: 12 mg via INTRAMUSCULAR
  Filled 2014-01-27: qty 2

## 2014-01-27 MED ORDER — FLEET ENEMA 7-19 GM/118ML RE ENEM: 1.0000 | ENEMA | RECTAL | Status: DC | PRN

## 2014-01-27 MED ORDER — ONDANSETRON HCL 4 MG/2ML IJ SOLN: 4.0000 mg | INTRAMUSCULAR | Status: DC | PRN

## 2014-01-27 MED ORDER — ONDANSETRON HCL 4 MG/2ML IJ SOLN: 4.0000 mg | Freq: Four times a day (QID) | INTRAMUSCULAR | Status: DC | PRN

## 2014-01-27 MED ORDER — OXYTOCIN BOLUS FROM INFUSION: 500.0000 mL | INTRAVENOUS | Status: DC

## 2014-01-27 MED ORDER — CITRIC ACID-SODIUM CITRATE 334-500 MG/5ML PO SOLN: 30.0000 mL | ORAL | Status: DC | PRN

## 2014-01-27 MED ORDER — LACTATED RINGERS IV BOLUS (SEPSIS)
1000.0000 mL | Freq: Once | INTRAVENOUS | Status: AC
  Administered 2014-01-27: 1000 mL via INTRAVENOUS

## 2014-01-27 MED ORDER — DIPHENHYDRAMINE HCL 25 MG PO CAPS: 25.0000 mg | ORAL_CAPSULE | Freq: Four times a day (QID) | ORAL | Status: DC | PRN

## 2014-01-27 MED ORDER — LANOLIN HYDROUS EX OINT: TOPICAL_OINTMENT | CUTANEOUS | Status: DC | PRN

## 2014-01-27 MED ORDER — MAGNESIUM SULFATE 40 G IN LACTATED RINGERS - SIMPLE
2.0000 g/h | INTRAVENOUS | Status: DC
  Administered 2014-01-27: 2 g/h via INTRAVENOUS
  Filled 2014-01-27: qty 500

## 2014-01-27 MED ORDER — PRENATAL MULTIVITAMIN CH
1.0000 | ORAL_TABLET | Freq: Every day | ORAL | Status: DC
  Administered 2014-01-28 – 2014-01-29 (×2): 1 via ORAL
  Filled 2014-01-27 (×2): qty 1

## 2014-01-27 MED ORDER — DIBUCAINE 1 % RE OINT: 1.0000 "application " | TOPICAL_OINTMENT | RECTAL | Status: DC | PRN

## 2014-01-27 MED ORDER — LACTATED RINGERS IV SOLN: INTRAVENOUS | Status: DC

## 2014-01-27 MED ORDER — OXYCODONE-ACETAMINOPHEN 5-325 MG PO TABS
1.0000 | ORAL_TABLET | ORAL | Status: DC | PRN
  Administered 2014-01-27 (×2): 1 via ORAL
  Filled 2014-01-27 (×2): qty 1

## 2014-01-27 MED ORDER — OXYCODONE-ACETAMINOPHEN 5-325 MG PO TABS: 1.0000 | ORAL_TABLET | ORAL | Status: DC | PRN

## 2014-01-27 MED ORDER — LACTATED RINGERS IV SOLN: 500.0000 mL | INTRAVENOUS | Status: DC | PRN

## 2014-01-27 MED ORDER — WITCH HAZEL-GLYCERIN EX PADS: 1.0000 "application " | MEDICATED_PAD | CUTANEOUS | Status: DC | PRN

## 2014-01-27 MED ORDER — ACETAMINOPHEN 325 MG PO TABS: 650.0000 mg | ORAL_TABLET | ORAL | Status: DC | PRN

## 2014-01-27 MED ORDER — IBUPROFEN 600 MG PO TABS
600.0000 mg | ORAL_TABLET | Freq: Four times a day (QID) | ORAL | Status: DC
  Administered 2014-01-27 – 2014-01-29 (×8): 600 mg via ORAL
  Filled 2014-01-27 (×8): qty 1

## 2014-01-27 MED ORDER — FENTANYL CITRATE 0.05 MG/ML IJ SOLN
100.0000 ug | Freq: Once | INTRAMUSCULAR | Status: DC
  Filled 2014-01-27: qty 2

## 2014-01-27 MED ORDER — BENZOCAINE-MENTHOL 20-0.5 % EX AERO: 1.0000 "application " | INHALATION_SPRAY | CUTANEOUS | Status: DC | PRN

## 2014-01-27 MED ORDER — TETANUS-DIPHTH-ACELL PERTUSSIS 5-2.5-18.5 LF-MCG/0.5 IM SUSP: 0.5000 mL | Freq: Once | INTRAMUSCULAR | Status: DC

## 2014-01-27 MED ORDER — SENNOSIDES-DOCUSATE SODIUM 8.6-50 MG PO TABS
2.0000 | ORAL_TABLET | ORAL | Status: DC
Start: 2014-01-28 — End: 2014-01-29
  Administered 2014-01-28 (×2): 2 via ORAL
  Filled 2014-01-27 (×2): qty 2

## 2014-01-27 MED ORDER — OXYTOCIN 40 UNITS IN LACTATED RINGERS INFUSION - SIMPLE MED
62.5000 mL/h | INTRAVENOUS | Status: DC
  Filled 2014-01-27: qty 1000

## 2014-01-27 NOTE — MAU Note (Signed)
[facility]. Possible previa. Waiting for MFM result

## 2014-01-27 NOTE — H&P (Addendum)
Regina Bennett is a 28 y.o. female [phone] with IUP at 2925w1d presenting to the MAU via EMS with contractions every 10 minutes associated with vaginal bleeding like a period that started this morning. Is continuing to have contraction-like lower abdominal pain and vaginal bleeding. Delivered her last baby in January of this year. Patient also reports 2 previous preterm deliveries at 32 weeks.  Denies prior c-sections. Denies any complications during this pregnancy. Denies drug use. Current smoker.   Patient has no prenatal care during this pregnancy.   Prenatal History/Complications:  Past Medical History: Past Medical History  Diagnosis Date  . Asthma     Past Surgical History: No past surgical history on file.  Obstetrical History: OB History   Grav Para Term Preterm Abortions TAB SAB Ect Mult Living   8 5 3 2 2 2  0 0 0 4      Social History: History   Social History  . Marital Status: Single    Spouse Name: N/A    Number of Children: N/A  . Years of Education: N/A   Social History Main Topics  . Smoking status: Current Every Day Smoker -- 0.50 packs/day    Types: Cigarettes  . Smokeless tobacco: Never Used  . Alcohol Use: No  . Drug Use: No  . Sexual Activity: Yes     Comment: last intercourse 04/11/13   Other Topics Concern  . Not on file   Social History Narrative  . No narrative on file    Family History: No family history on file.  Allergies: Allergies  Allergen Reactions  . Sulfa Antibiotics Other (See Comments)    Bad pains to back of neck    Prescriptions prior to admission  Medication Sig Dispense Refill  . ibuprofen (ADVIL,MOTRIN) 600 MG tablet Take 1 tablet (600 mg total) by mouth every 6 (six) hours.  30 tablet  0  . Prenatal Vit-Fe Fumarate-FA (PRENATAL MULTIVITAMIN) TABS tablet Take 1 tablet by mouth daily at 12 noon.        Review of Systems- As per HPI, otherwise all systems reviewed and are negative.   Blood pressure 117/66, pulse  84, temperature 98 F (36.7 C), temperature source Oral, resp. rate 18, last menstrual period 07/14/2013, SpO2 100.00%, unknown if currently breastfeeding. General appearance: alert, cooperative and no distress Lungs: clear to auscultation bilaterally Heart: regular rate and rhythm Abdomen: soft, non-tender; bowel sounds normal Extremities: Homans sign is negative, no sign of DVT DTR'Bennett wnl GU: Dark red vaginal blood noted on external exam with some pooling in bed Fetal monitoringBaseline: 150 bpm, Variability: Good {> 6 bpm), Accelerations: None and Decelerations: Occasional variable Uterine activity: Contraction every 10-15 minutes with uterine irritability   SVE: deferred     Prenatal labs: - None   Prenatal Transfer Tool  Maternal Diabetes: Unknown Genetic Screening: Unknown Maternal Ultrasounds/Referrals: Unknown Fetal Ultrasounds or other Referrals: Unknown Maternal Substance Abuse:  Unknown Significant Maternal Medications: Unknown Significant Maternal Lab Results: Unknown  No results found for this or any previous visit (from the past 24 hour(Bennett)).  Assessment: Regina Bennett is a 28 y.o. [id number] at 9225w1d by LMP with no prenatal care here with pelvic pain, contractions every 10-15 minutes, and vaginal bleeding.  P: Will admit for further evaluation and management Will obtain bedside US to evaluate for potential previa vs abruption CBC, type and screen, UDS, prenatal labs sent.  2U pRBC matched  Regina Bennett, Regina Bennett 01/27/2014, 11:19 AM  Addendum: SUBJECTIVE: Good FM. No LOF. No known  pregnancy problems or previous bleeding episodes.   OBJECTIVE: Filed Vitals:   01/27/14 1106  BP: 117/66  Pulse: 84  Temp: 98 F (36.7 C)  TempSrc: Oral  Resp: 18  SpO2: 100%   1155: More uncomfortable with UCs. SVE: 4/70/-3 bulging/sm-mod dark blood NICU notified. Dr. Shawnie PonsPratt consulted.  US prelim: vtx, BPD c/w 29 wks, no previa, nl AFV After my exam, vag probe US, tech cannot  r/o previa and will c/w MFM  12:45 Repeat Vaginal exam shows the head to be presenting, with cervix dilated to 7 cm and BBOW. Minimal-moderate vaginal bleeding.  3rd PTB.  ASSESSMENT: [place], NPC, third tri bleeding, PTL  PLAN: D/W mode of delivery with Dr. Shawnie PonsPratt.  BMZ. Given advanced dilation, discussed options of mode of delivery.  Suspect SVD ok--will change to C-section if heavy vaginal bleeding or fetal intolerance of labor.  Ampicillin given for GBS and Magnesium started for CP prophylaxis. May have epidural.  NPO.  Regina Bennett, CNM 01/27/2014 12:04 PM  Regina Bennett 01/27/2014 12:48 PM

## 2014-01-27 NOTE — MAU Note (Signed)
Pt presents complaining of contractions every 10 minutes and vaginal bleeding like a period. States it started at 10am. Pt has had no prenatal care. 2 previous deliveries at 32 weeks.

## 2014-01-27 NOTE — MAU Note (Signed)
Dr. Shawnie PonsPratt at bedside. Pt 6cm. Transfer to BS. [facility]. Will go to 167

## 2014-01-27 NOTE — H&P (Signed)
Attestation of Attending Supervision of Advanced Practitioner (PA/CNM/NP): Evaluation and management procedures were performed by the Advanced Practitioner under my supervision and collaboration.  I have reviewed the Advanced Practitioner's note and chart, and I agree with the management and plan.  Reva BoresPRATT,Trea Carnegie S, MD [facility] Attending 01/27/2014 12:44 PM

## 2014-01-27 NOTE — Progress Notes (Signed)
Patient ID: Regina RampShalonda Bennett, female   DOB: 1985/07/01, 28 y.o.   MRN: [id number] Regina Bennett is a 28 y.o. [id number] at 1863w1d admitted for PTL, marginal placenta previa with active bleeding Dr. Shawnie PonsPratt saw pt and OKs vaginal route Subjective: Urge to push  Objective: BP 117/66  Pulse 84  Temp(Src) 98 F (36.7 C) (Oral)  Resp 18  SpO2 100%  LMP 07/14/2013  Fetal Heart FHR: 145 bpm, variability: moderate,  accelerations:  Present,  decelerations:  Absent  Mild variables with UCs Contractions: q2-3  SVE:   Dilation: 10 Effacement (%): 100 Exam by:: dm Jaggar Benko cmn  Assessment / Plan:  Labor: 2nd stage Fetal Wellbeing Cat 2 Pain Control:  inadequate Expected mode of delivery: NSVD  Geena Weinhold 01/27/2014, 1:08 PM

## 2014-01-27 NOTE — MAU Note (Signed)
[facility] and strip reviewed. Will go to 167

## 2014-01-28 LAB — CBC
HCT: 30.7 % — ABNORMAL LOW (ref 36.0–46.0)
Hemoglobin: 10.1 g/dL — ABNORMAL LOW (ref 12.0–15.0)
MCH: 28.9 pg (ref 26.0–34.0)
MCHC: 32.9 g/dL (ref 30.0–36.0)
MCV: 87.7 fL (ref 78.0–100.0)
PLATELETS: 257 10*3/uL (ref 150–400)
RBC: 3.5 MIL/uL — ABNORMAL LOW (ref 3.87–5.11)
RDW: 12.3 % (ref 11.5–15.5)
WBC: 15.1 10*3/uL — ABNORMAL HIGH (ref 4.0–10.5)

## 2014-01-28 NOTE — Progress Notes (Signed)
I have seen and examined this patient and I agree with the above. SHAW, KIMBERLY CNM 9:04 AM 01/28/2014    

## 2014-01-28 NOTE — Progress Notes (Signed)
Post Partum Day 1 Subjective: no complaints, up ad lib, voiding, tolerating PO and + flatus. Breastfeeding. Would like BTL. Wants depo until procedure.  Objective: Blood pressure 105/52, pulse 70, temperature 98.6 F (37 C), temperature source Oral, resp. rate 18, height 5\' 5"  (1.651 m), weight 63.504 kg (140 lb), last menstrual period 07/14/2013, SpO2 100.00%, unknown if currently breastfeeding.  Physical Exam:  General: alert, cooperative and no distress Lochia: appropriate Uterine Fundus: firm DVT Evaluation: No evidence of DVT seen on physical exam.   Recent Labs  01/27/14 1125 01/28/14 0500  HGB 10.0* 10.1*  HCT 30.3* 30.7*    Assessment/Plan: Plan for discharge tomorrow and Breastfeeding   LOS: 1 day   Aldona BarKoch, Hamilton Marinello L 01/28/2014, 7:49 AM

## 2014-01-29 MED ORDER — IBUPROFEN 600 MG PO TABS: 600.0000 mg | ORAL_TABLET | Freq: Four times a day (QID) | ORAL | Status: AC | PRN

## 2014-01-29 NOTE — Discharge Instructions (Signed)

## 2014-01-29 NOTE — Discharge Summary (Signed)
Obstetric Discharge Summary Reason for Admission: preterm labor Prenatal Procedures: none Intrapartum Procedures: spontaneous vaginal delivery Postpartum Procedures: none Complications-Operative and Postpartum: none HEMOGLOBIN  Date Value Ref Range Status  01/28/2014 10.1* 12.0 - 15.0 g/dL Final   HCT  Date Value Ref Range Status  01/28/2014 30.7* 36.0 - 46.0 % Final   Brief Narrative Summary of hospital course Patient presented to MAU with contractions and vaginal bleeding.  Had history of two preterm labor, including a delivery approximately 9 months ago.  Magnesium sulfate was started, as well as Ampicillin and betamethasone.  The patient proceeded to have SVD - see delivery note below.  Postpartum care unremarkable.  BTL papers signed - will have patient follow up in clinic for depo provera in 1-2 days and to have office visit two weeks to arrange interval BTL.   Expand All Collapse All   Delivery Note NN team present prior to delivery. SROM> clear with pushing. At 1:18 PM a vigorous viable female was delivered via (Presentation: OA>LOA; ). APGAR: , ; weight .  Placenta status: spontaneous, intact, 3VC. Cord: with the following complications: none . Cord pH: pending Anesthesia: none Episiotomy: none Lacerations: none Suture Repair: n/a Est. Blood Loss (mL): 400 Placenta to pathology  Mom to postpartum. Baby to NICU.  POE,DEIRDRE 01/27/2014, 1:34 PM        Physical Exam:  General: alert, cooperative and no distress Lochia: appropriate Uterine Fundus: firm DVT Evaluation: No evidence of DVT seen on physical exam. Negative Homan's sign. No cords or calf tenderness.  Discharge Diagnoses: Premature labor  Discharge Information: Date: 01/29/2014 Activity: pelvic rest Diet: routine Medications: PNV and Ibuprofen Condition: stable Instructions: refer to practice specific booklet Discharge to: home Follow-up Information    [facility]. Schedule an appointment as soon as possible for a visit in 2 days.   Why:  for depo provera   Contact information:   [address] [place] [id number] [phone]      Newborn Data: Live born female  Birth Weight: 3 lb 3.2 oz (1452 g) APGAR: 8, 8  Still in NICU.  Regina Bennett 01/29/2014, 10:51 AM

## 2014-01-29 NOTE — Plan of Care (Signed)
Problem: Discharge Progression Outcomes Goal: Barriers To Progression Addressed/Resolved Outcome: Completed/Met Date Met:  01/29/14 Goal: Complications resolved/controlled Outcome: Completed/Met Date Met:  01/29/14 Goal: Afebrile, VS remain stable at discharge Outcome: Completed/Met Date Met:  01/29/14 Goal: Discharge plan in place and appropriate Outcome: Completed/Met Date Met:  01/29/14 Goal: Other Discharge Outcomes/Goals Outcome: Not Applicable Date Met:  01/29/14     

## 2014-01-29 NOTE — Progress Notes (Signed)
Discharge instructions provided to patient and significant other at bedside.  Activity, medications, follow up appointments, when to call the doctor and community resources discussed.  No questions at this time.  Patient left unit in stable condition with all personal belongings accompanied by staff.  K. Junette Bernat, RN------------------------  

## 2014-01-30 ENCOUNTER — Encounter (HOSPITAL_COMMUNITY): Payer: Self-pay

## 2014-01-30 NOTE — Progress Notes (Signed)
Post discharge ur review completed. 

## 2014-01-31 LAB — TYPE AND SCREEN
ABO/RH(D): A POS
Antibody Screen: NEGATIVE
UNIT DIVISION: 0
UNIT DIVISION: 0
Unit division: 0
Unit division: 0
Unit division: 0
Unit division: 0

## 2014-02-01 ENCOUNTER — Encounter: Payer: Self-pay | Admitting: *Deleted

## 2014-02-01 ENCOUNTER — Ambulatory Visit (INDEPENDENT_AMBULATORY_CARE_PROVIDER_SITE_OTHER): Payer: Self-pay | Admitting: *Deleted

## 2014-02-01 VITALS — BP 119/74 | HR 76 | Temp 97.7°F

## 2014-02-01 DIAGNOSIS — Z30013 Encounter for initial prescription of injectable contraceptive: Secondary | ICD-10-CM

## 2014-02-01 MED ORDER — MEDROXYPROGESTERONE ACETATE 104 MG/0.65ML ~~LOC~~ SUSP
104.0000 mg | Freq: Once | SUBCUTANEOUS | Status: AC
  Administered 2014-02-01: 104 mg via SUBCUTANEOUS

## 2014-02-01 NOTE — Progress Notes (Signed)
Depo Provera 104mg  sq for birth control

## 2014-02-05 ENCOUNTER — Ambulatory Visit: Payer: Self-pay

## 2014-02-05 NOTE — Lactation Note (Signed)
This note was copied from the chart of Regina TransMontaigneShalonda Bennett. Lactation Consultation Note  Follow up visit made at infant's bedside in NICU.  Mom has concerns about severe uterine cramping with pumping.  She has stopped pumping due to pain.  She also finds pumping challenging due to caring for her 309 and 1918 month old.  Discussed with mom that hormones released to allow milk to flow also cause uterine contractions.  Reassured mom that pain should lessen daily as uterus involutes.  Mom expresses desire to breastfeed this baby and provide breastmilk.  Breasts full today.  Reviewed supply and demand .  Stressed importance of pumping on a regular basis to maintain supply. Instructed to continue ibuprofen as directed.  Mom will ask her family to assist with her smaller children over next few days so she can begin pumping again.  Encouraged to call for further concerns.  Patient Name: Regina Ronnald RampShalonda Tugwell MWUXL'KToday's Date: 02/05/2014     Maternal Data    Feeding Feeding Type: Donor Breast Milk Length of feed: 60 min  LATCH Score/Interventions                      Lactation Tools Discussed/Used     Consult Status      Huston FoleyMOULDEN, Hillary Struss S 02/05/2014, 3:10 PM

## 2014-02-15 ENCOUNTER — Telehealth: Payer: Self-pay | Admitting: *Deleted

## 2014-02-15 ENCOUNTER — Encounter: Payer: Self-pay | Admitting: *Deleted

## 2014-02-15 ENCOUNTER — Encounter: Payer: Medicaid Other | Admitting: Obstetrics and Gynecology

## 2014-02-15 NOTE — Telephone Encounter (Signed)
Attempted to contact patient to inform of missed appointment, no answer, left message for patient to please call and reschedule.  Will send letter.  Letter sent.

## 2014-03-17 ENCOUNTER — Telehealth: Payer: Self-pay | Admitting: *Deleted

## 2014-03-17 ENCOUNTER — Encounter: Payer: Self-pay | Admitting: Family Medicine

## 2014-03-17 ENCOUNTER — Ambulatory Visit: Payer: Medicaid Other | Admitting: Family Medicine

## 2014-03-17 ENCOUNTER — Encounter: Payer: Self-pay | Admitting: *Deleted

## 2014-03-17 NOTE — Telephone Encounter (Signed)
Regina Bennett missed a scheduled appointment today. Called her and left a message with a female that she needs to call the clinic on Monday about an appointment . Will send letter.

## 2014-04-24 ENCOUNTER — Ambulatory Visit: Payer: Medicaid Other

## 2015-06-29 IMAGING — US US OB TRANSVAGINAL
1 series · 14 of 17 positions shown · non-contrast
Comparison: none

CLINICAL DATA: 26-year-old pregnant female with right pelvic pain.
Assigned gestational age of 28 weeks 5 days by 1st ultrasound.

EXAM:
LIMITED OBSTETRIC ULTRASOUND AND TRANSVAGINAL OBSTETRIC ULTRASOUND

[Series 1: us ob transvaginal · 0.22mm/px · 14 of 17 slices shown]
[im 1/17]
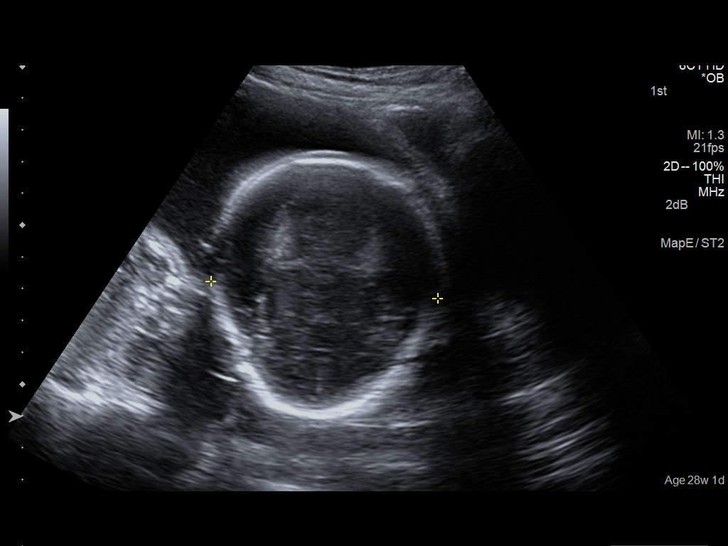
[im 2/17]
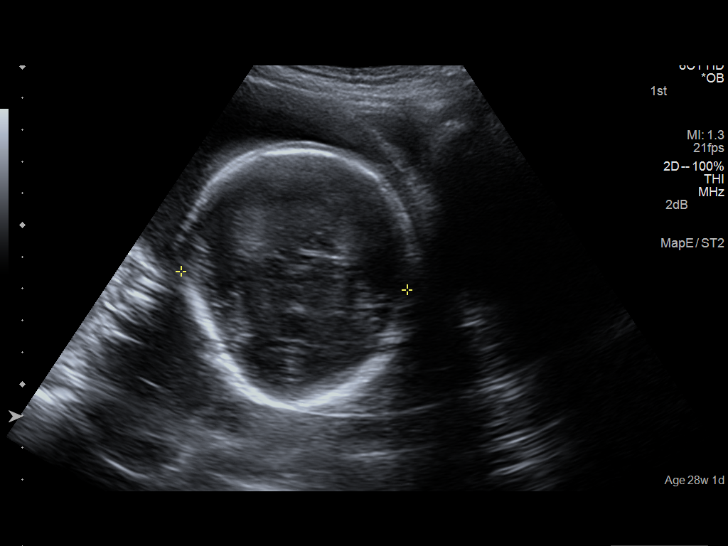
[im 4/17]
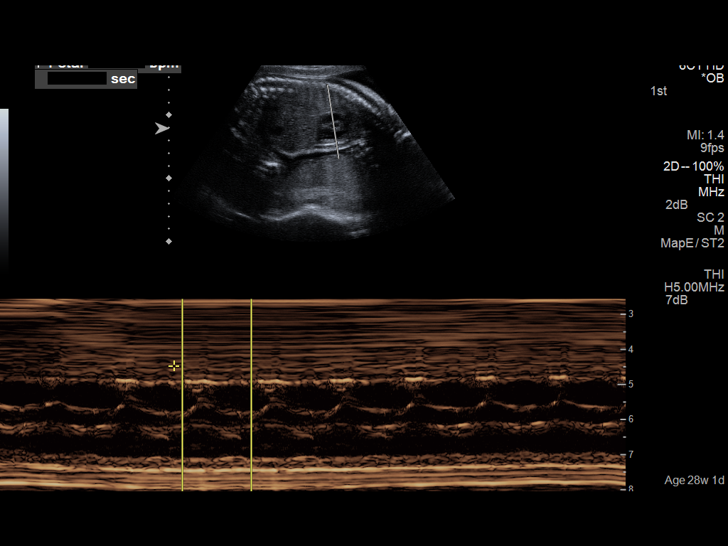
[im 5/17]
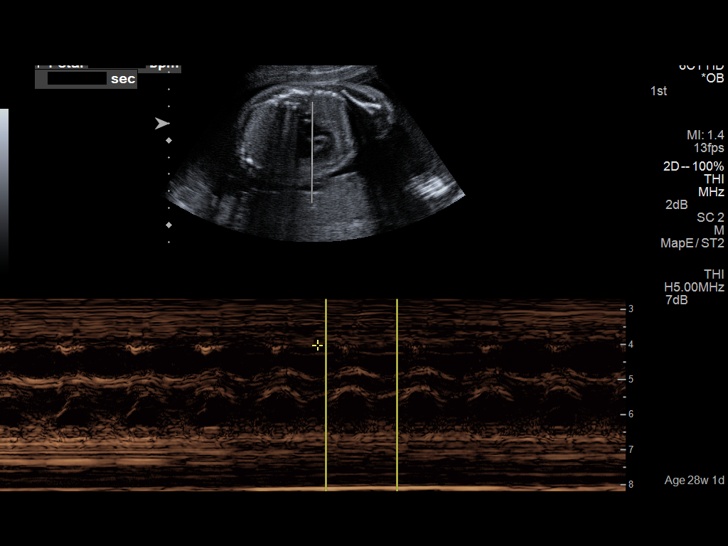
[im 6/17]
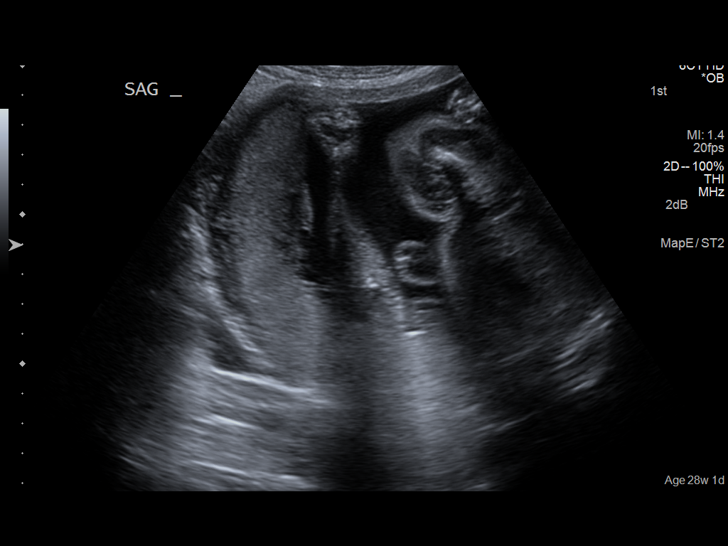
[im 7/17]
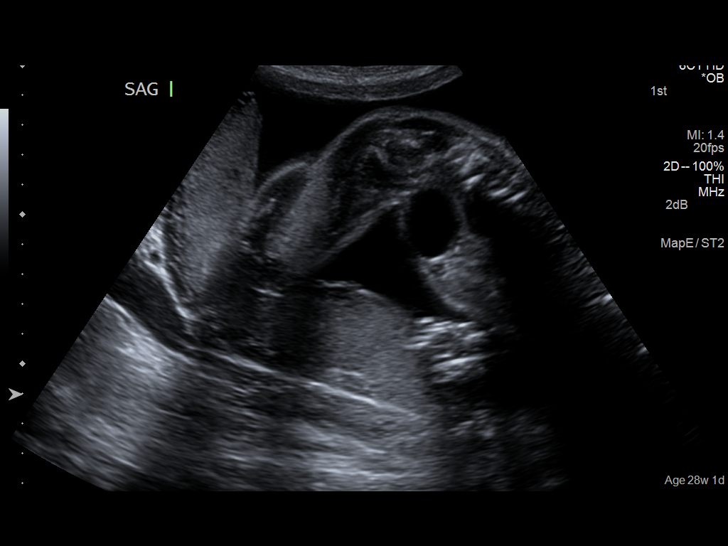
[im 8/17]
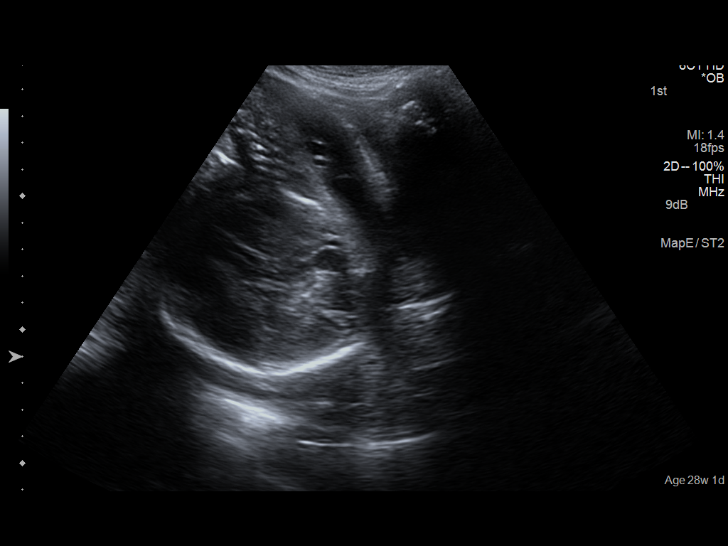
[im 10/17]
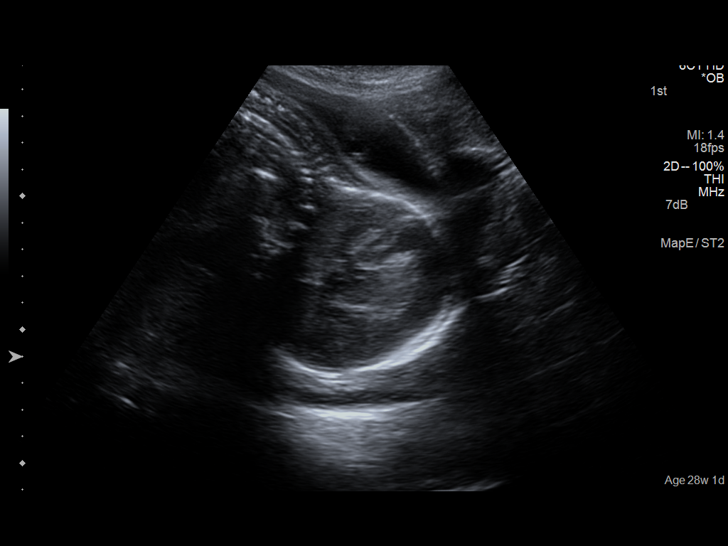
[im 11/17]
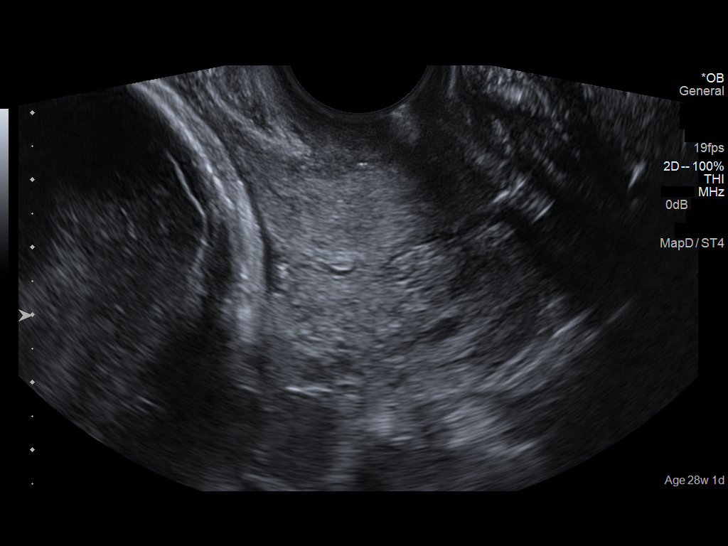
[im 12/17]
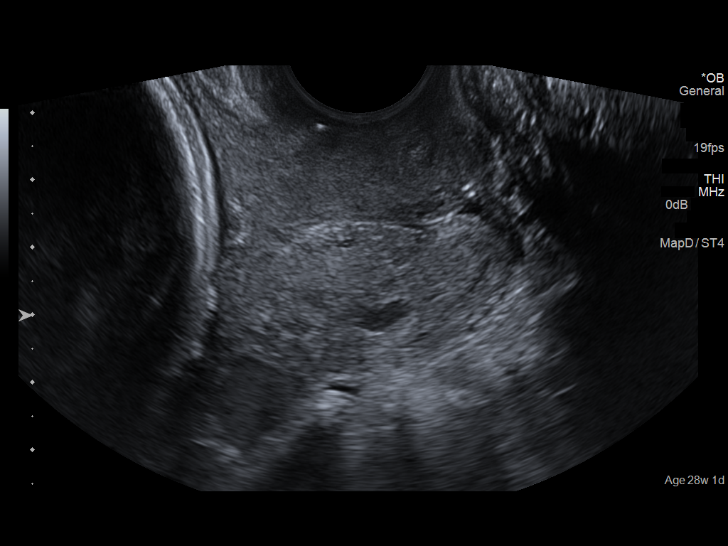
[im 13/17]
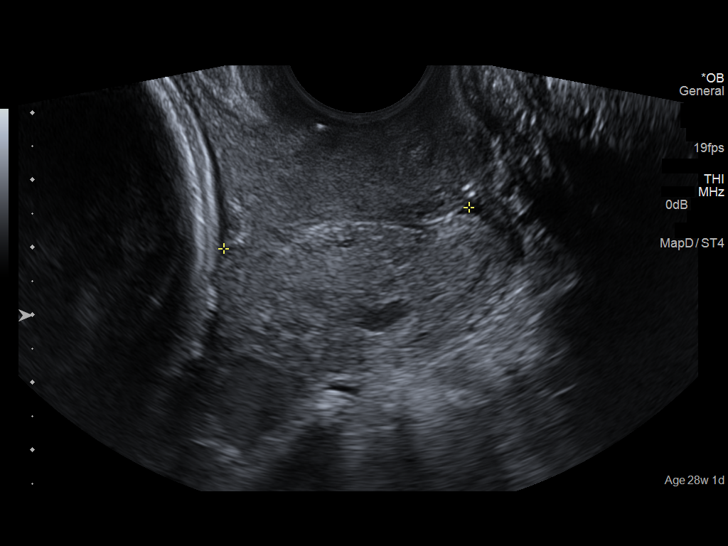
[im 14/17]
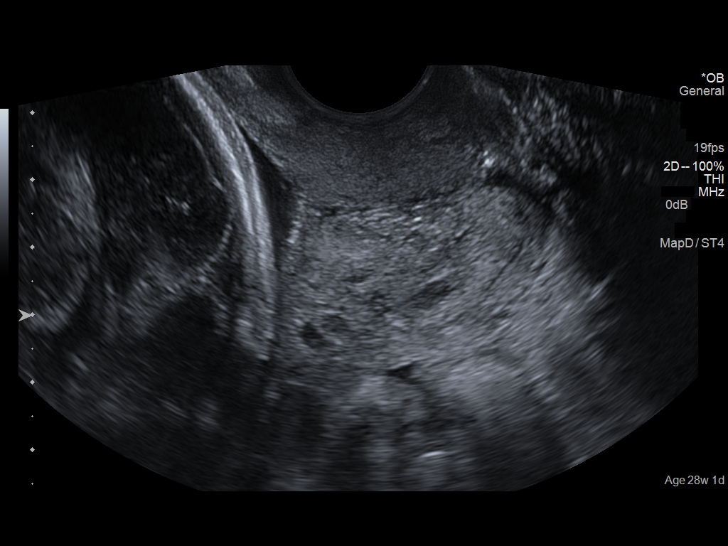
[im 16/17]
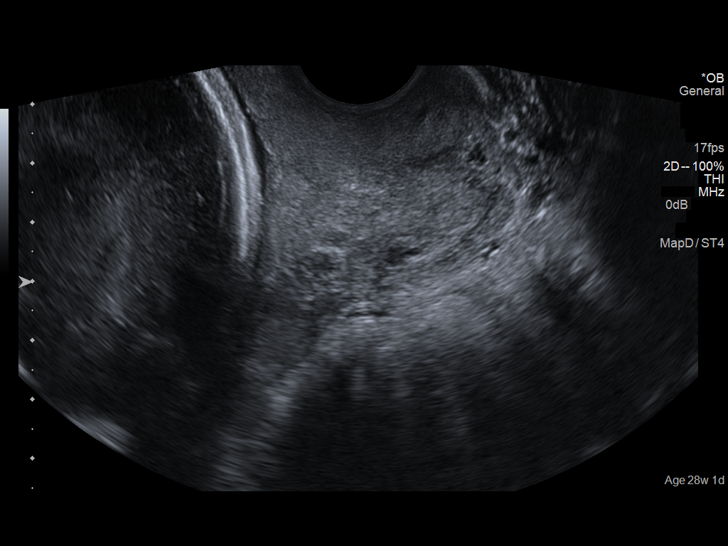
[im 17/17]
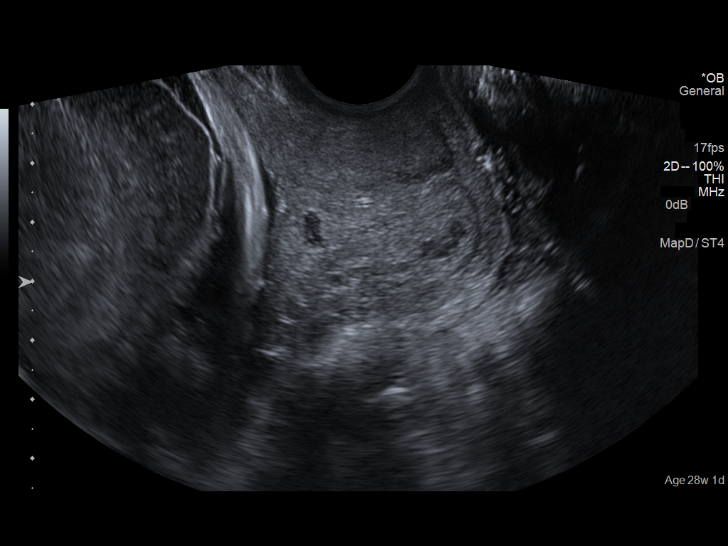

[14 of 17 positions shown; findings below may reference images not displayed]

FINDINGS: Number of Fetuses: 1

Heart Rate:  144 bpm

Movement: Yes

Presentation: Cephalic

Placental Location: Fundal posterior

Previa:  No

Amniotic Fluid (Subjective):  Within normal limits.

BPD:  71.5cm  28w  5d

MATERNAL FINDINGS:

Cervix:  Appears closed.  Length of 3.7 cm.

Uterus/Adnexae:  No abnormality visualized.
IMPRESSION: Single living intrauterine gestation with assigned gestational age
of 28 weeks 5 days.

No significant abnormalities identified.

This exam is performed on an emergent basis and does not
comprehensively evaluate fetal size, dating, or anatomy; follow-up
complete OB US should be considered if further fetal assessment is
warranted.
